# Patient Record
Sex: Male | Born: 2000 | Race: Black or African American | Hispanic: No | Marital: Single | Smoking: Never smoker
Health system: Southern US, Community
[De-identification: ages and names within clinical notes are randomized; demographics above are authoritative.]

## PROBLEM LIST (undated history)

## (undated) DIAGNOSIS — F909 Attention-deficit hyperactivity disorder, unspecified type: Secondary | ICD-10-CM

---
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
## 2021-02-11 ENCOUNTER — Telehealth: Payer: Self-pay | Admitting: Pediatrics

## 2021-04-25 IMAGING — DX RIGHT FEMUR 2 VIEWS
4 series · 4 of 4 positions shown · non-contrast
Comparison: None.

CLINICAL DATA: Fall during baseball.

EXAM:
RIGHT FEMUR 2 VIEWS

[femur ap (1 of 2)]
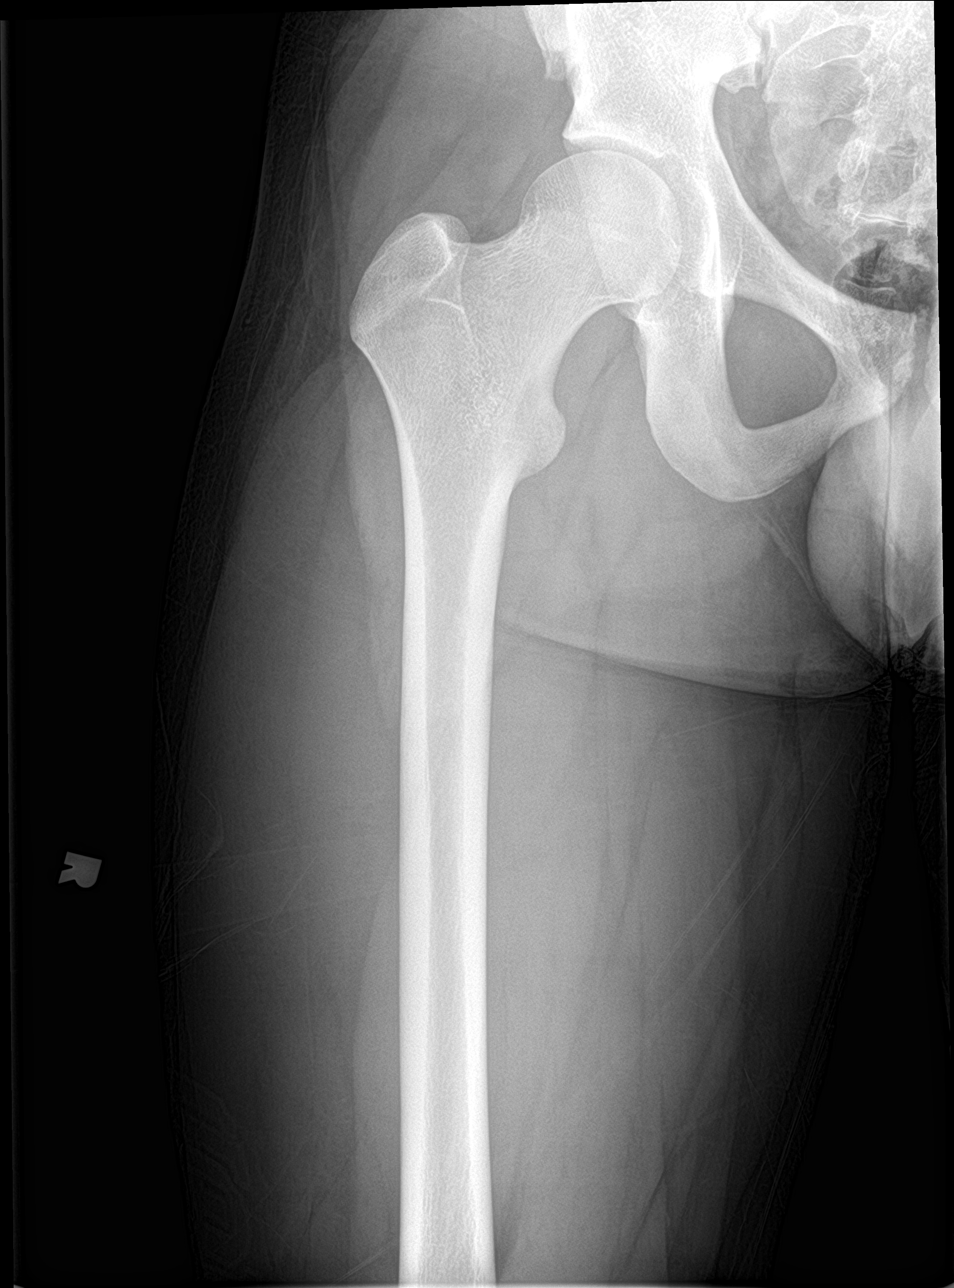

[femur ap (2 of 2)]
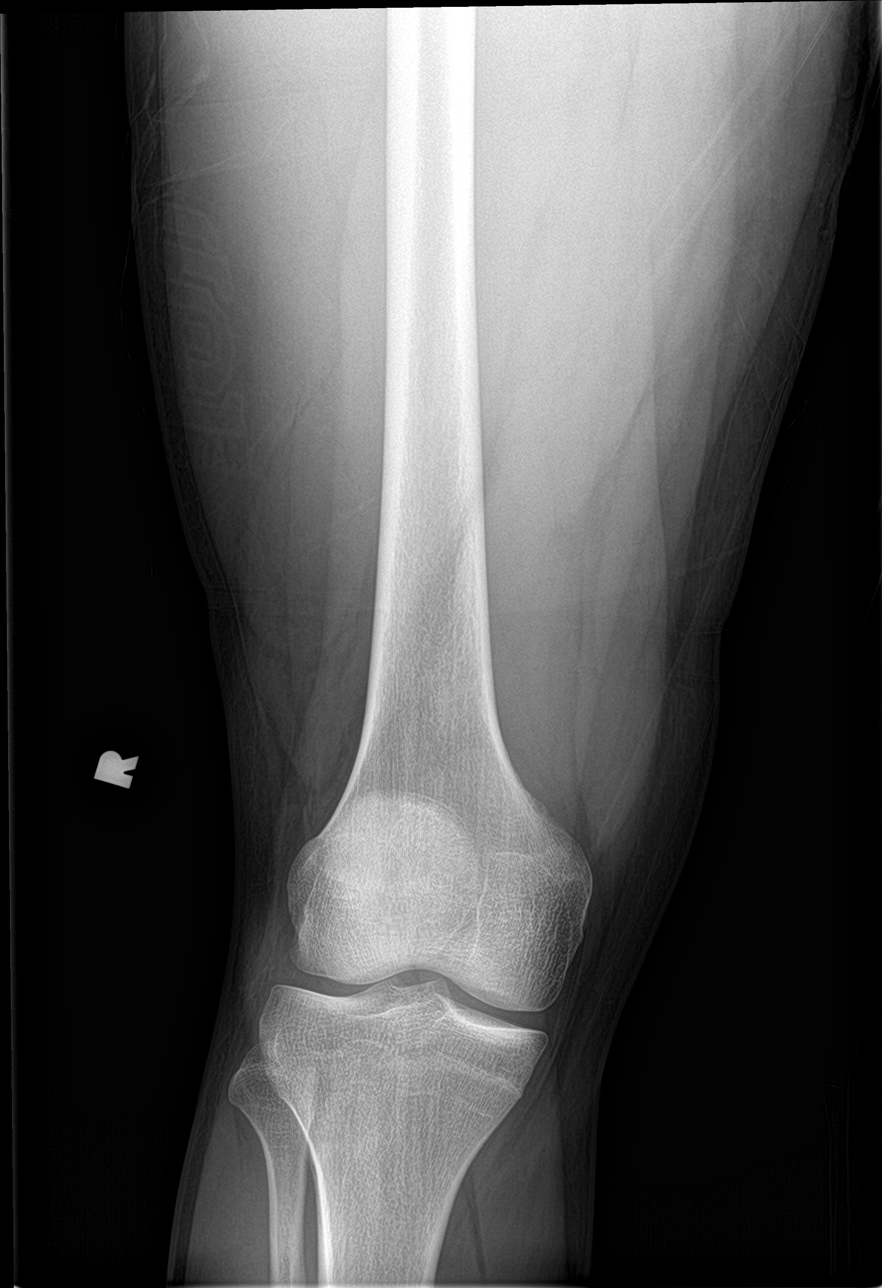

[femur lat (1 of 2)]
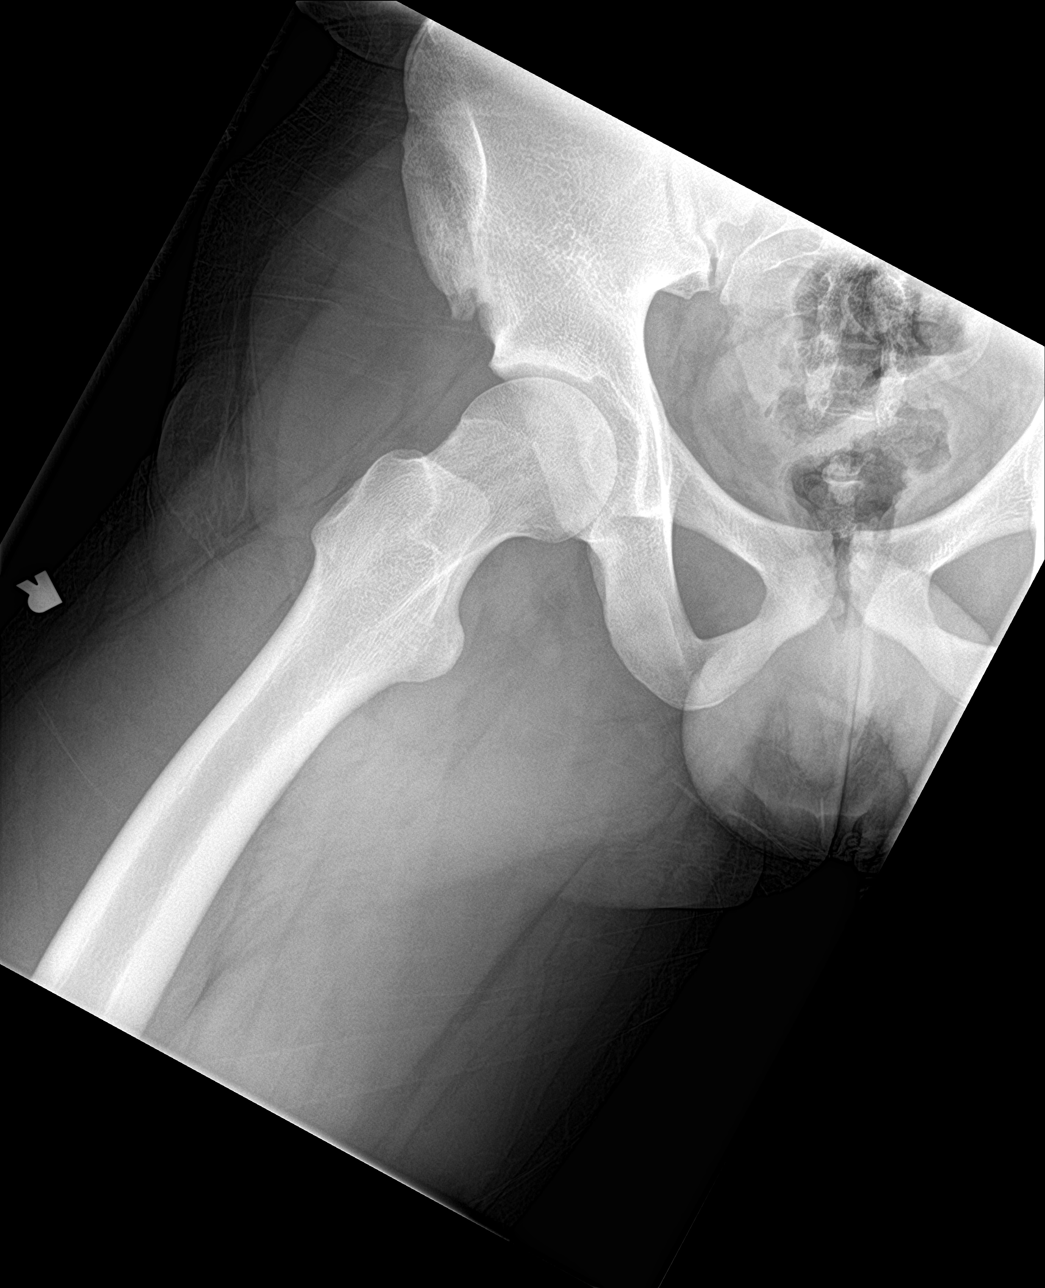

[femur lat (2 of 2)]
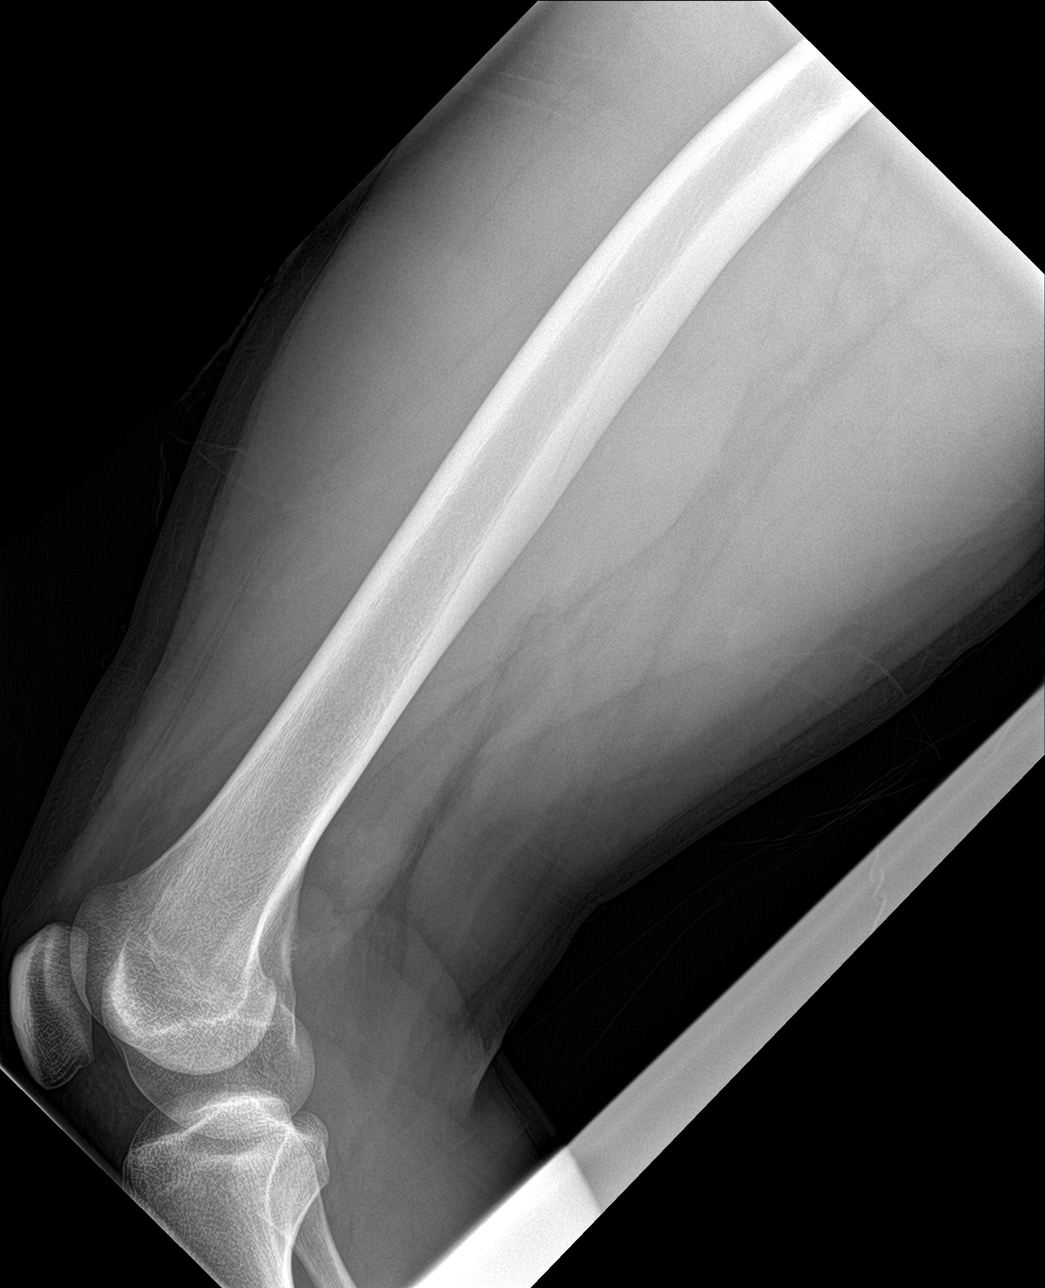

[4 of 4 positions shown; findings below may reference images not displayed]

FINDINGS: There is no evidence of fracture or other focal bone lesions. Soft
tissues are unremarkable.
IMPRESSION: Negative.

## 2024-04-24 ENCOUNTER — Other Ambulatory Visit: Payer: Self-pay

## 2024-04-24 ENCOUNTER — Encounter (HOSPITAL_COMMUNITY): Payer: Self-pay

## 2024-04-24 ENCOUNTER — Ambulatory Visit
Admission: EM | Admit: 2024-04-24 | Discharge: 2024-04-24 | Disposition: A | Discharge status: Home / Self Care | Attending: Family Medicine | Admitting: Family Medicine

## 2024-04-24 ENCOUNTER — Emergency Department (HOSPITAL_COMMUNITY)

## 2024-04-24 ENCOUNTER — Emergency Department (HOSPITAL_COMMUNITY)
Admission: EM | Admit: 2024-04-24 | Discharge: 2024-04-24 | Disposition: A | Discharge status: Home / Self Care | Attending: Emergency Medicine | Admitting: Emergency Medicine

## 2024-04-24 DIAGNOSIS — R9431 Abnormal electrocardiogram [ECG] [EKG]: Secondary | ICD-10-CM

## 2024-04-24 DIAGNOSIS — R0789 Other chest pain: Secondary | ICD-10-CM | POA: Diagnosis not present

## 2024-04-24 DIAGNOSIS — R079 Chest pain, unspecified: Secondary | ICD-10-CM | POA: Diagnosis present

## 2024-04-24 LAB — BASIC METABOLIC PANEL WITH GFR
Anion gap: 10 (ref 5–15)
BUN: 17 mg/dL (ref 6–20)
CO2: 25 mmol/L (ref 22–32)
Calcium: 9.7 mg/dL (ref 8.9–10.3)
Chloride: 104 mmol/L (ref 98–111)
Creatinine, Ser: 1.22 mg/dL (ref 0.61–1.24)
GFR, Estimated: >60 mL/min (ref >60)
Glucose, Bld: 85 mg/dL (ref 70–99)
Potassium: 4.4 mmol/L (ref 3.5–5.1)
Sodium: 139 mmol/L (ref 135–145)

## 2024-04-24 LAB — CBC
HCT: 46.7 % (ref 39.0–52.0)
Hemoglobin: 14.9 g/dL (ref 13.0–17.0)
MCH: 26 pg (ref 26.0–34.0)
MCHC: 31.9 g/dL (ref 30.0–36.0)
MCV: 81.4 fL (ref 80.0–100.0)
Platelets: 278 K/uL (ref 150–400)
RBC: 5.74 MIL/uL (ref 4.22–5.81)
RDW: 13.7 % (ref 11.5–15.5)
WBC: 8.7 K/uL (ref 4.0–10.5)
nRBC: 0 % (ref 0.0–0.2)

## 2024-04-24 LAB — TROPONIN I (HIGH SENSITIVITY): Troponin I (High Sensitivity): 7 ng/L (ref <18)

## 2024-04-24 MED ORDER — ASPIRIN 81 MG PO CHEW
324.0000 mg | CHEWABLE_TABLET | Freq: Once | ORAL | Status: AC
  Administered 2024-04-24: 324 mg via ORAL
  Filled 2024-04-24: qty 4

## 2024-04-24 NOTE — ED Provider Triage Note (Signed)
 Emergency Medicine Provider Triage Evaluation Note  Calvin Clark , a 23 y.o. male  was evaluated in triage.  Pt complains of cp. Report having pain to L chest, felt nauseous and lightheaded on his way to class today.  Went to [facility] and had an abnormal EKG, sent here for further assessment.  Minimal chest discomfort currently, no sob or diaphresis.  No cardiac hx.  Does smoke weed  Review of Systems  Positive: As above Negative: As above  Physical Exam  BP (!) 156/70 (BP Location: Right Arm)   Pulse 86   Temp 98.1 F (36.7 C)   Resp 20   Ht 5' 10 (1.778 m)   Wt 106.6 kg   SpO2 100%   BMI 33.72 kg/m  Gen:   Awake, no distress   Resp:  Normal effort  MSK:   Moves extremities without difficulty  Other:    Medical Decision Making  Medically screening exam initiated at 6:44 PM.  Appropriate orders placed.  Calvin A General was informed that the remainder of the evaluation will be completed by another provider, this initial triage assessment does not replace that evaluation, and the importance of remaining in the ED until their evaluation is complete.  EKG shows inferior ST depression.     Nivia Colon, PA-C 04/24/24 [phone]

## 2024-04-24 NOTE — ED Provider Notes (Signed)
 [place] [facility] AT [facility] Provider Note   CSN: [id number] Arrival date & time: 04/24/24  8188     Patient presents with: Abnormal ECG and Chest Pain   Calvin Clark is a 23 y.o. male.    Chest Pain Associated symptoms: nausea   Patient presents for chest pain.  Medical history includes ADHD.   For the past 10 days, he has had intermittent mid to left-sided chest pain.  He has no personal or family history of early cardiac disease.  He describes episodes of chest pain occurring when he is not distracted with anything else.  Typically throughout the day when he is active, he does not notice it.  When he lays down at night, he will.  He wakes up feeling fine.  Today he had an episode of chest pain with associated nausea and lightheadedness.   He went to urgent care.  Urgent care was concerned about an abnormal EKG. patient is used to being involved in organized sports.  This is the first time he has been focusing only on school.  He does continue to workout aggressively, including lifting weights.  He occasionally drinks energy drinks.  He no longer takes Vyvanse .  He occasionally drinks alcohol.     Prior to Admission medications   Medication Sig Start Date End Date Taking? Authorizing Provider  Lisdexamfetamine Dimesylate  (VYVANSE ) 50 MG CHEW Chew 50 mg by mouth every morning. 12/01/16   Juanice Debby DEL, MD    Allergies: Patient has no known allergies.    Review of Systems  Cardiovascular:  Positive for chest pain.  Gastrointestinal:  Positive for nausea.  Neurological:  Positive for light-headedness.  All other systems reviewed and are negative.   Updated Vital Signs BP (!) 138/97 (BP Location: Right Arm)   Pulse 78   Temp 98.4 F (36.9 C) (Oral)   Resp 18   Ht 5' 10 (1.778 m)   Wt 106.6 kg   SpO2 100%   BMI 33.72 kg/m   Physical Exam Vitals and nursing note reviewed.  Constitutional:      General: He is not in acute distress.     Appearance: He is well-developed. He is not ill-appearing, toxic-appearing or diaphoretic.  HENT:     Head: Normocephalic and atraumatic.  Eyes:     Conjunctiva/sclera: Conjunctivae normal.  Cardiovascular:     Rate and Rhythm: Normal rate and regular rhythm.     Heart sounds: No murmur heard. Pulmonary:     Effort: Pulmonary effort is normal. No tachypnea or respiratory distress.  Chest:     Chest wall: No tenderness.  Abdominal:     Palpations: Abdomen is soft.     Tenderness: There is no abdominal tenderness.  Musculoskeletal:        General: No swelling. Normal range of motion.     Cervical back: Normal range of motion and neck supple.  Skin:    General: Skin is warm and dry.     Coloration: Skin is not cyanotic or pale.  Neurological:     General: No focal deficit present.     Mental Status: He is alert and oriented to person, place, and time.  Psychiatric:        Mood and Affect: Mood normal.        Behavior: Behavior normal.     (all labs ordered are listed, but only abnormal results are displayed) Labs Reviewed  BASIC METABOLIC PANEL WITH GFR  CBC  TROPONIN  I (HIGH SENSITIVITY)    EKG: EKG Interpretation Date/Time:  Tuesday April 24 2024 18:36:03 EDT Ventricular Rate:  86 PR Interval:  120 QRS Duration:  96 QT Interval:  332 QTC Calculation: 397 R Axis:   56  Text Interpretation: Normal sinus rhythm Confirmed by Melvenia Motto [id number]) on 04/24/2024 8:22:31 PM  Radiology: DG Chest 2 View Result Date: 04/24/2024 CLINICAL DATA:  Chest pain. Abnormal ECG. Pain for 1 and half weeks. Diaphoresis today. EXAM: CHEST - 2 VIEW COMPARISON:  None Available. FINDINGS: The heart size and mediastinal contours are within normal limits. Both lungs are clear. The visualized skeletal structures are unremarkable. IMPRESSION: No active cardiopulmonary disease. Electronically Signed   By: Elsie Gravely M.D.   On: 04/24/2024 20:19     Procedures   Medications Ordered in the ED   aspirin  chewable tablet 324 mg (324 mg Oral Given 04/24/24 1913)                                    Medical Decision Making  This patient presents to the ED for concern of chest pain, this involves an extensive number of treatment options, and is a complaint that carries with it a high risk of complications and morbidity.  The differential diagnosis includes ACS, pericarditis, costochondritis, muscle strain, GERD, anxiety   Co morbidities / Chronic conditions that complicate the patient evaluation  ADHD   Additional history obtained:  Additional history obtained from EMR External records from outside source obtained and reviewed including patient's family   Lab Tests:  I Ordered, and personally interpreted labs.  The pertinent results include: Normal hemoglobin, no leukocytosis, normal kidney function, normal electrolytes, normal troponin   Imaging Studies ordered:  I ordered imaging studies including chest x-ray I independently visualized and interpreted imaging which showed no acute findings I agree with the radiologist interpretation   Cardiac Monitoring: / EKG:  The patient was maintained on a cardiac monitor.  I personally viewed and interpreted the cardiac monitored which showed an underlying rhythm of: Sinus rhythm   Problem List / ED Course / Critical interventions / Medication management  Patient presenting for 10 days of intermittent left-sided chest pain.  Occasionally, he will have associated lightheadedness and nausea.  Episodes seem to occur when he is on distracted by other daily activities.  There may be a anxiety component to his symptoms.  Currently, he is asymptomatic.  He is well-appearing on exam.  He has no chest tenderness.  EKG shows some inverted T waves in inferior leads.  Only prior EKG for comparison was when patient was 23 years old.  He has not had dynamic changes to his EKG today.  Lab work today is also reassuring with normal troponin.  X-ray  does not show any acute findings.  On monitor, patient maintains a normal sinus rhythm.  Patient is stable for discharge at this time.  He was advised to abstain from energy drinks and alcohol.  Cardiology referral was ordered for follow-up and consideration of further testing.  Patient was discharged in stable condition.   Social Determinants of Health:  Lives independently      Final diagnoses:  Chest pain, unspecified type    ED Discharge Orders          Ordered    Ambulatory referral to Cardiology       Comments: If you have not heard from the Cardiology office within  the next 72 hours please call [phone].   04/24/24 2046               Melvenia Motto, MD 04/24/24 2047

## 2024-04-24 NOTE — ED Notes (Signed)
 Patient is being discharged from the Urgent Care and sent to the Emergency Department via POV (mother) . Per [place], Calvin Clark, patient is in need of higher level of care due to chest pain. Patient is aware and verbalizes understanding of plan of care.  Vitals:   04/24/24 1716  BP: (!) 146/92  Pulse: 90  Resp: 16  Temp: 98.6 F (37 C)  SpO2: 97%

## 2024-04-24 NOTE — Discharge Instructions (Addendum)
 Your test results today are reassuring.  A referral for a cardiology follow-up has been ordered.  If you do not hear from their office in the next few days, call the telephone number below.  In the meantime, abstain from energy drinks and alcohol.  Return to the emergency department for any new or worsening symptoms of concern.

## 2024-04-24 NOTE — ED Provider Notes (Signed)
 [facility] - URGENT CARE C[facility]  Note:  This document was prepared using Conservation officer, historic buildings and may include unintentional dictation errors.  MRN: [id number] DOB: 2000/09/16  Subjective:   Calvin Clark is a 23 y.o. male presenting for 1.5-week history of persistent intermittent mid to left-sided chest pain.  Currently in clinic it is mild to moderate.  Patient reports that the chest pain is come on with activity and generally has lasted 1 to 2 days resolving without much intervention.  Initially had nausea associated with it and chest wall tenderness superficially but that has resolved and his chest pain is now more internal.  Patient is very active, exercises regularly.  He has played sports but is not currently participating on any particular team.  No known cardiac history.  No known family history of sudden cardiac death.  No current facility-administered medications for this encounter.  Current Outpatient Medications:    Lisdexamfetamine Dimesylate  (VYVANSE ) 50 MG CHEW, Chew 50 mg by mouth every morning., Disp: 30 tablet, Rfl: 0   No Known Allergies  Past Medical History:  Diagnosis Date   ADHD      History reviewed. No pertinent surgical history.  Family History  Problem Relation Age of Onset   Healthy Mother     Social History   Tobacco Use   Smoking status: Never   Smokeless tobacco: Never  Vaping Use   Vaping status: Never Used  Substance Use Topics   Alcohol use: No   Drug use: Not Currently    Types: Marijuana    ROS   Objective:   Vitals: BP (!) 146/92 (BP Location: Left Arm)   Pulse 90   Temp 98.6 F (37 C) (Oral)   Resp 16   SpO2 97%   Physical Exam Constitutional:      General: He is not in acute distress.    Appearance: Normal appearance. He is well-developed. He is not ill-appearing, toxic-appearing or diaphoretic.  HENT:     Head: Normocephalic and atraumatic.     Right Ear: External ear normal.     Left Ear:  External ear normal.     Nose: Nose normal.     Mouth/Throat:     Mouth: Mucous membranes are moist.  Eyes:     General: No scleral icterus.       Right eye: No discharge.        Left eye: No discharge.     Extraocular Movements: Extraocular movements intact.  Neck:     Vascular: No carotid bruit.  Cardiovascular:     Rate and Rhythm: Normal rate and regular rhythm.     Heart sounds: Normal heart sounds. No murmur heard.    No friction rub. No gallop.  Pulmonary:     Effort: Pulmonary effort is normal. No respiratory distress.     Breath sounds: Normal breath sounds. No stridor. No wheezing, rhonchi or rales.  Chest:     Chest wall: No tenderness.  Neurological:     Mental Status: He is alert and oriented to person, place, and time.  Psychiatric:        Mood and Affect: Mood normal.        Behavior: Behavior normal.        Thought Content: Thought content normal.    ED ECG REPORT   Date: 04/24/2024  EKG Time: 5:53 PM  Rate: 94bpm  Rhythm: normal sinus rhythm  Axis: normal  Intervals:none  ST&T Change: T wave inversion in lead  III, aVF  Narrative Interpretation: Sinus rhythm at 94 bpm with T wave inversion in inferior leads, signs of left ventricular hypertrophy.  The T wave inversion is starkly different from previous EKGs.   Assessment and Plan :   PDMP not reviewed this encounter.  1. Atypical chest pain   2. Nonspecific abnormal electrocardiogram (ECG) (EKG)    Patient is in need of a higher level of care than can be provided in the urgent care setting.  My primary concern is his atypical chest pain with signs of inferior ischemia on his EKG is an acute change from prior EKGs.  Consider HOCM.  Patient presented with his mother who contracted for safety and plans on transporting patient to the emergency room now by personal vehicle.  Patient is hemodynamically stable and therefore this would be appropriate.   Christopher Savannah, [place] 04/24/24 1756

## 2024-04-24 NOTE — ED Triage Notes (Addendum)
 The patient reports he was sent from UC today with concerns of compromised blood flow to his heart due to abnormal EKG with signs of inferior ischemia. His EKG at Urgent Care showed sinus rhythm at 94 bpm with T wave inversion in inferior leads, signs of left ventricular hypertrophy. The T wave inversion is starkly different from previous EKGs.   He was at [facility] with c/o chest pain x 1.5 weeks. Denies current CP or shortness of breath.

## 2024-04-24 NOTE — Discharge Instructions (Signed)
 Please head straight to the emergency room as I am concerned that you are in need of a higher level of testing and care than we can provide in the urgent care setting. Your EKG shows that you may have compromised blood flow to your heart. This requires emergency level testing. Please head straight to the emergency room.

## 2024-04-24 NOTE — ED Triage Notes (Signed)
 Pt reports on and off random left side chest pain , nausea x 1 1/2 week. Pt reports at the beginning when he was walking or standing up was giving relief, but not anymore.
